# Patient Record
Sex: Female | Born: 2000 | Hispanic: No | Marital: Single
Health system: Southern US, Community
[De-identification: ages and names within clinical notes are randomized; demographics above are authoritative.]

---

## 2018-01-15 ENCOUNTER — Other Ambulatory Visit: Payer: Self-pay

## 2018-01-15 ENCOUNTER — Emergency Department (HOSPITAL_COMMUNITY)
Admission: EM | Admit: 2018-01-15 | Discharge: 2018-01-15 | Disposition: A | Discharge status: Home / Self Care | Attending: Emergency Medicine | Admitting: Emergency Medicine

## 2018-01-15 DIAGNOSIS — R030 Elevated blood-pressure reading, without diagnosis of hypertension: Secondary | ICD-10-CM | POA: Diagnosis not present

## 2018-01-15 DIAGNOSIS — L089 Local infection of the skin and subcutaneous tissue, unspecified: Secondary | ICD-10-CM | POA: Diagnosis not present

## 2018-01-15 DIAGNOSIS — Z4800 Encounter for change or removal of nonsurgical wound dressing: Secondary | ICD-10-CM | POA: Diagnosis present

## 2018-01-15 LAB — POC URINE PREG, ED: Preg Test, Ur: NEGATIVE

## 2018-01-15 MED ORDER — DOXYCYCLINE HYCLATE 100 MG PO TABS
100.0000 mg | ORAL_TABLET | Freq: Once | ORAL | Status: AC
  Administered 2018-01-15: 100 mg via ORAL
  Filled 2018-01-15: qty 1

## 2018-01-15 MED ORDER — DOXYCYCLINE HYCLATE 100 MG PO CAPS: 100.0000 mg | ORAL_CAPSULE | Freq: Two times a day (BID) | ORAL | 0 refills | Status: AC

## 2018-01-15 NOTE — ED Provider Notes (Signed)
[facility] EMERGENCY DEPARTMENT Provider Note   CSN: [id number] Arrival date & time: 01/15/18  1638     History   Chief Complaint Chief Complaint  Patient presents with  . Wound Check    HPI Renee Long is a 17 y.o. female.  HPI   Patient is a 17 year old female with no significant past medical history presenting for skin and soft tissue infections of the right lower extremity.  Patient reports that she presented to urgent care approximately 5 days ago for 2 areas on her right lower extremity just distal to the knee that began as pustules but were beginning to have expanding erythema.  Patient reports that they had completely drained at that time, and urgent care outlined the areas of redness and prescribed Keflex.  Patient reports that she has had temperature up to 99, but denies any chills, Reiger's, abdominal pain, nausea, vomiting.  Patient reports that she is continued to pick at the wounds, and is now notices an area of "black" in the center of the largest area.  Patient denies any known wound or injury to the area.  Tetanus shot up-to-date.  No past medical history on file.  There are no active problems to display for this patient.     OB History   None      Home Medications    Prior to Admission medications   Not on File    Family History No family history on file.  Social History Social History   Tobacco Use  . Smoking status: Not on file  Substance Use Topics  . Alcohol use: Not on file  . Drug use: Not on file     Allergies   Patient has no known allergies.   Review of Systems Review of Systems  Constitutional: Negative for chills and fever.  Gastrointestinal: Negative for abdominal pain, nausea and vomiting.  Musculoskeletal: Negative for myalgias.  Skin: Positive for color change and wound.  All other systems reviewed and are negative.    Physical Exam Updated Vital Signs BP (!) 146/91 (BP Location: Right Arm)    Pulse 87   Temp 98.4 F (36.9 C) (Oral)   Resp 18   SpO2 100%   Physical Exam  Constitutional: She appears well-developed and well-nourished. No distress.  HENT:  Head: Normocephalic and atraumatic.  Mouth/Throat: Oropharynx is clear and moist.  Eyes: Pupils are equal, round, and reactive to light. Conjunctivae and EOM are normal.  Neck: Normal range of motion. Neck supple.  Cardiovascular: Normal rate, regular rhythm, S1 normal and S2 normal.  No murmur heard. Pulmonary/Chest: Effort normal and breath sounds normal. She has no wheezes. She has no rales.  Abdominal: Soft. She exhibits no distension.  Musculoskeletal: Normal range of motion. She exhibits no edema or deformity.  Neurological: She is alert.  Cranial nerves grossly intact. Patient moves extremities symmetrically and with good coordination.  Skin: Skin is warm and dry. No rash noted. There is erythema.  There is a 2.5 cm in diameter area of induration lateral to the right knee with excoriated skin overlying but no fluctuance.  Patient has a well-healing lesion with 1.5 cm of surrounding erythema just distal to the patella of the right lower extremity  Psychiatric: She has a normal mood and affect. Her behavior is normal. Judgment and thought content normal.  Nursing note and vitals reviewed.      ED Treatments / Results  Labs (all labs ordered are listed, but only abnormal results are  displayed) Labs Reviewed  POC URINE PREG, ED    EKG None  Radiology No results found.  Procedures Procedures (including critical care time)  EMERGENCY DEPARTMENT US SOFT TISSUE INTERPRETATION "Study: Limited Soft Tissue Ultrasound"  INDICATIONS: Soft tissue infection Multiple views of the body part were obtained in real-time with a multi-frequency linear probe PERFORMED BY:  Myself IMAGES ARCHIVED?: No SIDE:Right  BODY PART:Lower extremity FINDINGS: No abcess noted and Cellulitis present INTERPRETATION:  No abcess  noted and Cellulitis present   CPT: Neck 16109-60  Upper extremity 76880-26  Axilla 45409-81  Chest wall 19147-82  Beast 95621-30  Upper back 86578-46  Lower back 96295-28  Abdominal wall 41324-40  Pelvic wall 10272-53  Lower extremity 66440-34  Other soft tissue 74259-56   Medications Ordered in ED Medications - No data to display   Initial Impression / Assessment and Plan / ED Course  I have reviewed the triage vital signs and the nursing notes.  Pertinent labs & imaging results that were available during my care of the patient were reviewed by me and considered in my medical decision making (see chart for details).     Patient nontoxic-appearing, afebrile, and in no acute distress.  Patient with what appears to be purulent skin and soft tissue infections of the right lower extremity that have already drained.  Ultrasound was performed at bedside, which reveals no fluid collection, and there is active conduit for drainage.  As patient was on Keflex, will switch to doxycycline for MRSA coverage.  Patient was given return precautions for any increasing erythema beyond the area marked in 48 hours, fevers, chills, or systemic symptoms.  Patient is in understanding and agrees with the plan of care.  Discussed elevated blood pressure with the patient.  Patient will follow-up with primary care.  Final Clinical Impressions(s) / ED Diagnoses   Final diagnoses:  Skin infection  Elevated blood-pressure reading without diagnosis of hypertension    ED Discharge Orders         Ordered    doxycycline (VIBRAMYCIN) 100 MG capsule  2 times daily     01/15/18 1839           Delia Chimes 01/15/18 1840    Linwood Dibbles, MD 01/17/18 0730

## 2018-01-15 NOTE — Discharge Instructions (Signed)
Please see the information and instructions below regarding your visit.  Your diagnoses today include:  1. Skin infection   2. Elevated blood-pressure reading without diagnosis of hypertension    Cellulitis is a superficial skin infection. Please take your antibiotics as prescribed for their ENTIRE prescribed duration.   Tests performed today include: See side panel of your discharge paperwork for testing performed today. Vital signs are listed at the bottom of these instructions.   Medications prescribed:    Take any prescribed medications only as prescribed, and any over the counter medications only as directed on the packaging.  1. Please take all of your antibiotics until finished.   You may develop abdominal discomfort or nausea from the antibiotic. If this occurs, you may take it with food. Some patients also get diarrhea with antibiotics. You may help offset this with probiotics which you can buy or get in yogurt. Do not eat or take the probiotics until 2 hours after your antibiotic. Some women develop vaginal yeast infections after antibiotics. If you develop unusual vaginal discharge after being on this medication, please see your primary care provider.   Some people develop allergies to antibiotics. Symptoms of antibiotic allergy can be mild and include a flat rash and itching. They can also be more serious and include:  ?Hives - Hives are raised, red patches of skin that are usually very itchy.  ?Lip or tongue swelling  ?Trouble swallowing or breathing  ?Blistering of the skin or mouth.  If you have any of these serious symptoms, please seek emergency medical care immediately.  Home care instructions:  Please follow any educational materials contained in this packet.    Keep affected area above the level of your heart when possible. Wash area gently twice a day with warm soapy water. Do not apply alcohol or hydrogen peroxide. Cover the area if it draining or weeping.    Follow-up instructions: Please follow-up with your primary care provider or the ED in 48 hours for a check of the infection if symptoms are not improving.   Return instructions:  Please return to the Emergency Department if you experience worsening symptoms. Call your doctor sooner or return to the ER if you develop worsening signs of infection such as: increased redness, increased pain, pus, fever, or other symptoms that concern you. Please monitor the area we marked with a pen today. Please return if you have any other emergent concerns.  Additional Information:   Your vital signs today were: BP (!) 146/91 (BP Location: Right Arm)    Pulse 87    Temp 98.4 F (36.9 C) (Oral)    Resp 18    SpO2 100%  If your blood pressure (BP) was elevated on multiple readings during this visit above 130 for the top number or above 80 for the bottom number, please have this repeated by your primary care provider within one month. --------------  Thank you for allowing [place] to participate in your care today. It was a pleasure taking care of you today!

## 2018-01-15 NOTE — ED Notes (Signed)
Patient verbalizes understanding of discharge instructions. Opportunity for questioning and answers were provided. Armband removed by staff, pt discharged from ED.  

## 2018-01-15 NOTE — ED Triage Notes (Addendum)
Pt arrives for further evaluation of a wound to the right lateral leg/knee that originally looked like an abscess, she was seen and given antibiotics. She states now it looks worse and has an area of black tissue to the center with some bleeding noted. Circle noted around wound that was marked by urgent care to monitor for expansion of redness. Pt also has one of these wounds to the front of the knee. Pt states this was not a spider bite.

## 2019-01-22 ENCOUNTER — Other Ambulatory Visit: Payer: Self-pay | Admitting: Family Medicine

## 2019-01-22 ENCOUNTER — Other Ambulatory Visit (HOSPITAL_COMMUNITY)
Admission: RE | Admit: 2019-01-22 | Discharge: 2019-01-22 | Disposition: A | Discharge status: Home / Self Care | Source: Ambulatory Visit | Attending: Family Medicine | Admitting: Family Medicine

## 2019-01-22 DIAGNOSIS — N898 Other specified noninflammatory disorders of vagina: Secondary | ICD-10-CM | POA: Diagnosis not present

## 2019-01-25 LAB — CERVICOVAGINAL ANCILLARY ONLY
Bacterial vaginitis: NEGATIVE
Candida vaginitis: NEGATIVE
Chlamydia: NEGATIVE
Neisseria Gonorrhea: NEGATIVE
Trichomonas: NEGATIVE

## 2020-05-24 ENCOUNTER — Emergency Department (HOSPITAL_COMMUNITY)
Admission: EM | Admit: 2020-05-24 | Discharge: 2020-05-24 | Disposition: A | Discharge status: Home / Self Care | Payer: Self-pay | Attending: Emergency Medicine | Admitting: Emergency Medicine

## 2020-05-24 ENCOUNTER — Other Ambulatory Visit: Payer: Self-pay

## 2020-05-24 ENCOUNTER — Emergency Department (HOSPITAL_COMMUNITY): Payer: Self-pay

## 2020-05-24 DIAGNOSIS — R030 Elevated blood-pressure reading, without diagnosis of hypertension: Secondary | ICD-10-CM | POA: Insufficient documentation

## 2020-05-24 DIAGNOSIS — R0789 Other chest pain: Secondary | ICD-10-CM | POA: Insufficient documentation

## 2020-05-24 NOTE — ED Triage Notes (Signed)
Patient c/o pain to sternum since last night. Reports "play wrestling with boyfriend and his head hit my sternum." Reports pain worsens with palpation and movement. Denies SOB.

## 2020-05-24 NOTE — ED Provider Notes (Signed)
[facility]-EMERGENCY DEPT Provider Note   CSN: [id number] Arrival date & time: 05/24/20  1429     History Chief Complaint  Patient presents with  . Chest Pain    Renee Long is a 20 y.o. female.  She has no significant past medical history.  She said she was struck in the sternum when she was wrestling with her boyfriend and was hit by his head.  Complaining of sternal pain worse with breathing and moving.  This occurred last night.  No hemoptysis.  No other injuries or complaints.  The history is provided by the patient.  Chest Pain Pain location:  Substernal area Pain quality: sharp   Pain radiates to:  Does not radiate Pain severity:  Moderate Onset quality:  Sudden Duration:  2 days Timing:  Constant Progression:  Unchanged Chronicity:  New Context: trauma   Relieved by:  Nothing Worsened by:  Coughing, deep breathing and movement Ineffective treatments:  None tried Associated symptoms: no abdominal pain, no cough, no diaphoresis, no fever, no headache and no shortness of breath        History reviewed. No pertinent past medical history.  There are no problems to display for this patient.   History reviewed. No pertinent surgical history.   OB History   No obstetric history on file.     History reviewed. No pertinent family history.     Home Medications Prior to Admission medications   Not on File    Allergies    Patient has no known allergies.  Review of Systems   Review of Systems  Constitutional: Negative for diaphoresis and fever.  HENT: Negative for sore throat.   Eyes: Negative for visual disturbance.  Respiratory: Negative for cough and shortness of breath.   Cardiovascular: Positive for chest pain.  Gastrointestinal: Negative for abdominal pain.  Genitourinary: Negative for dysuria.  Musculoskeletal: Negative for neck pain.  Skin: Negative for wound.  Neurological: Negative for headaches.    Physical  Exam Updated Vital Signs BP (!) 125/95 (BP Location: Left Arm)   Pulse 82   Temp 97.9 F (36.6 C) (Oral)   Resp 14   SpO2 100%   Physical Exam Vitals and nursing note reviewed.  Constitutional:      General: She is not in acute distress.    Appearance: She is well-developed and well-nourished.  HENT:     Head: Normocephalic and atraumatic.     Mouth/Throat:     Mouth: Oropharynx is clear and moist.  Eyes:     Conjunctiva/sclera: Conjunctivae normal.  Cardiovascular:     Rate and Rhythm: Normal rate and regular rhythm.     Pulses: Intact distal pulses.     Heart sounds: No murmur heard.   Pulmonary:     Effort: Pulmonary effort is normal. No respiratory distress.     Breath sounds: Normal breath sounds. No stridor. No wheezing.  Chest:     Chest wall: Tenderness (sternal) present. No crepitus.  Abdominal:     Palpations: Abdomen is soft.     Tenderness: There is no abdominal tenderness.  Musculoskeletal:        General: No tenderness or edema.     Cervical back: Neck supple.  Skin:    General: Skin is warm and dry.  Neurological:     General: No focal deficit present.     Mental Status: She is alert.     GCS: GCS eye subscore is 4. GCS verbal subscore is 5. GCS  motor subscore is 6.  Psychiatric:        Mood and Affect: Mood and affect normal.     ED Results / Procedures / Treatments   Labs (all labs ordered are listed, but only abnormal results are displayed) Labs Reviewed - No data to display  EKG None  Radiology DG Chest 2 View  Result Date: 05/24/2020 CLINICAL DATA:  Chest injury while wrestling yesterday. Midsternal region pain. EXAM: CHEST - 2 VIEW COMPARISON:  None. FINDINGS: Lungs are adequately inflated and otherwise clear. Cardiomediastinal silhouette is normal. Bones and soft tissues are normal. IMPRESSION: No acute findings. Electronically Signed   By: Elberta Fortis M.D.   On: 05/24/2020 15:58   DG Sternum  Result Date: 05/24/2020 CLINICAL  DATA:  Chest wall injury midsternal region after wrestling yesterday. EXAM: STERNUM - 2+ VIEW COMPARISON:  None. FINDINGS: There is no evidence of fracture or other focal bone lesions. IMPRESSION: No acute findings. Electronically Signed   By: Elberta Fortis M.D.   On: 05/24/2020 16:00    Procedures Procedures (including critical care time)  Medications Ordered in ED Medications - No data to display  ED Course  I have reviewed the triage vital signs and the nursing notes.  Pertinent labs & imaging results that were available during my care of the patient were reviewed by me and considered in my medical decision making (see chart for details).  Clinical Course as of 05/25/20 3785  Sat May 24, 2020  1549 Chest x-ray and sternal x-ray interpreted by me as no pneumothorax no gross fractures.  Awaiting radiology reading [MB]  1609 Reviewed results with patient she is comfortable plan for outpatient symptomatic management. [MB]    Clinical Course User Index [MB] Terrilee Files, MD   MDM Rules/Calculators/A&P                         Differential diagnosis includes contusion, sternal fracture, pneumothorax, rib fracture  Final Clinical Impression(s) / ED Diagnoses Final diagnoses:  Chest wall pain    Rx / DC Orders ED Discharge Orders    None       Terrilee Files, MD 05/25/20 [phone]

## 2020-05-24 NOTE — Discharge Instructions (Signed)
You were seen in the emergency department for evaluation of pain in the chest after a blunt chest injury.  You had x-rays of your chest and sternum that did not show any fracture.  You can try heating pad and ibuprofen.  Increase activity as tolerated.  Return if any worsening or concerning symptoms

## 2020-05-25 ENCOUNTER — Encounter (HOSPITAL_COMMUNITY): Payer: Self-pay | Admitting: Emergency Medicine

## 2021-05-28 ENCOUNTER — Encounter (HOSPITAL_COMMUNITY): Payer: Self-pay

## 2021-05-28 ENCOUNTER — Other Ambulatory Visit: Payer: Self-pay

## 2021-05-28 ENCOUNTER — Ambulatory Visit (HOSPITAL_COMMUNITY)
Admission: EM | Admit: 2021-05-28 | Discharge: 2021-05-28 | Disposition: A | Discharge status: Home / Self Care | Payer: 59 | Attending: Family Medicine | Admitting: Family Medicine

## 2021-05-28 DIAGNOSIS — M62838 Other muscle spasm: Secondary | ICD-10-CM | POA: Diagnosis not present

## 2021-05-28 DIAGNOSIS — M436 Torticollis: Secondary | ICD-10-CM | POA: Diagnosis not present

## 2021-05-28 MED ORDER — DIPHENHYDRAMINE HCL 50 MG/ML IJ SOLN
50.0000 mg | Freq: Once | INTRAMUSCULAR | Status: AC
  Administered 2021-05-28: 50 mg via INTRAMUSCULAR

## 2021-05-28 MED ORDER — DIPHENHYDRAMINE HCL 50 MG/ML IJ SOLN
INTRAMUSCULAR | Status: AC
  Filled 2021-05-28: qty 1

## 2021-05-28 MED ORDER — CYCLOBENZAPRINE HCL 10 MG PO TABS: ORAL_TABLET | ORAL | 0 refills | Status: AC

## 2021-05-28 NOTE — Discharge Instructions (Addendum)
Meds ordered this encounter  Medications   cyclobenzaprine (FLEXERIL) 10 MG tablet    Sig: Take 1 tablet by mouth 3 times daily as needed for muscle spasm. Warning: May cause drowsiness.    Dispense:  21 tablet    Refill:  0   diphenhydrAMINE (BENADRYL) injection 50 mg

## 2021-05-28 NOTE — ED Provider Notes (Signed)
°  [facility] CARE CENTER   [id number] 05/28/21 Arrival Time: 1433  ASSESSMENT & PLAN:  1. Torticollis, acute   2. Muscle spasms of neck   No trauma.  Meds ordered this encounter  Medications   cyclobenzaprine (FLEXERIL) 10 MG tablet    Sig: Take 1 tablet by mouth 3 times daily as needed for muscle spasm. Warning: May cause drowsiness.    Dispense:  21 tablet    Refill:  0   diphenhydrAMINE (BENADRYL) injection 50 mg   Work note provided.  Will f/u with her doctor or here if not seeing significant improvement within one week.  Reviewed expectations re: course of current medical issues. Questions answered. Outlined signs and symptoms indicating need for more acute intervention. Patient verbalized understanding. After Visit Summary given.  SUBJECTIVE: History from: patient. Renee Long is a 21 y.o. female who reports L sided neck pain/spasm; noted after waking 2 d ago. No trauma. No HA/visual changes. No extremity sensation changes or weakness. Ibuprofen without relief. No h/o similar..   OBJECTIVE:  Vitals:   05/28/21 1450  BP: 124/82  Pulse: 92  Resp: 16  Temp: 98.3 F (36.8 C)  TempSrc: Oral  SpO2: 100%    General appearance: alert; no distress HEENT: normocephalic; atraumatic Neck: supple with FROM but moves slowly; no midline tenderness; does have tenderness/tightness of cervical musculature extending over trapezius distribution only on the left Lungs: unlabored Heart: regular Extremities: moves all extremities normally; no edema; symmetrical with no gross deformities Skin: warm and dry Neurologic: gait normal; normal sensation and strength of all extremities Psychological: alert and cooperative; normal mood and affect  No Known Allergies History reviewed. No pertinent past medical history. History reviewed. No pertinent surgical history. No family history on file. Social History   Socioeconomic History   Marital status: Single    Spouse name: Not on  file   Number of children: Not on file   Years of education: Not on file   Highest education level: Not on file  Occupational History   Not on file  Tobacco Use   Smoking status: Not on file   Smokeless tobacco: Not on file  Substance and Sexual Activity   Alcohol use: Not on file   Drug use: Not on file   Sexual activity: Not on file  Other Topics Concern   Not on file  Social History Narrative   Not on file   Social Determinants of Health   Financial Resource Strain: Not on file  Food Insecurity: Not on file  Transportation Needs: Not on file  Physical Activity: Not on file  Stress: Not on file  Social Connections: Not on file           Mardella Layman, MD 05/28/21 1617

## 2021-05-28 NOTE — ED Triage Notes (Signed)
Pt presents to the office for right side neck pain x 2 days. Denis any injury to the area.

## 2021-10-20 DIAGNOSIS — R69 Illness, unspecified: Secondary | ICD-10-CM | POA: Diagnosis not present

## 2021-10-20 DIAGNOSIS — Z124 Encounter for screening for malignant neoplasm of cervix: Secondary | ICD-10-CM | POA: Diagnosis not present

## 2021-10-20 DIAGNOSIS — Z01419 Encounter for gynecological examination (general) (routine) without abnormal findings: Secondary | ICD-10-CM | POA: Diagnosis not present

## 2021-10-20 DIAGNOSIS — R87610 Atypical squamous cells of undetermined significance on cytologic smear of cervix (ASC-US): Secondary | ICD-10-CM | POA: Diagnosis not present

## 2021-10-20 DIAGNOSIS — Z30011 Encounter for initial prescription of contraceptive pills: Secondary | ICD-10-CM | POA: Diagnosis not present

## 2022-11-03 DIAGNOSIS — Z113 Encounter for screening for infections with a predominantly sexual mode of transmission: Secondary | ICD-10-CM | POA: Diagnosis not present

## 2022-11-03 DIAGNOSIS — Z124 Encounter for screening for malignant neoplasm of cervix: Secondary | ICD-10-CM | POA: Diagnosis not present

## 2022-11-03 DIAGNOSIS — Z01419 Encounter for gynecological examination (general) (routine) without abnormal findings: Secondary | ICD-10-CM | POA: Diagnosis not present

## 2022-11-03 DIAGNOSIS — N898 Other specified noninflammatory disorders of vagina: Secondary | ICD-10-CM | POA: Diagnosis not present

## 2022-11-03 DIAGNOSIS — Z3041 Encounter for surveillance of contraceptive pills: Secondary | ICD-10-CM | POA: Diagnosis not present

## 2022-12-20 IMAGING — CR DG CHEST 2V
2 series · 2 of 2 positions shown · non-contrast
Comparison: None.

CLINICAL DATA: Chest injury while wrestling yesterday. Midsternal
region pain.

EXAM:
CHEST - 2 VIEW

[w chest pa]
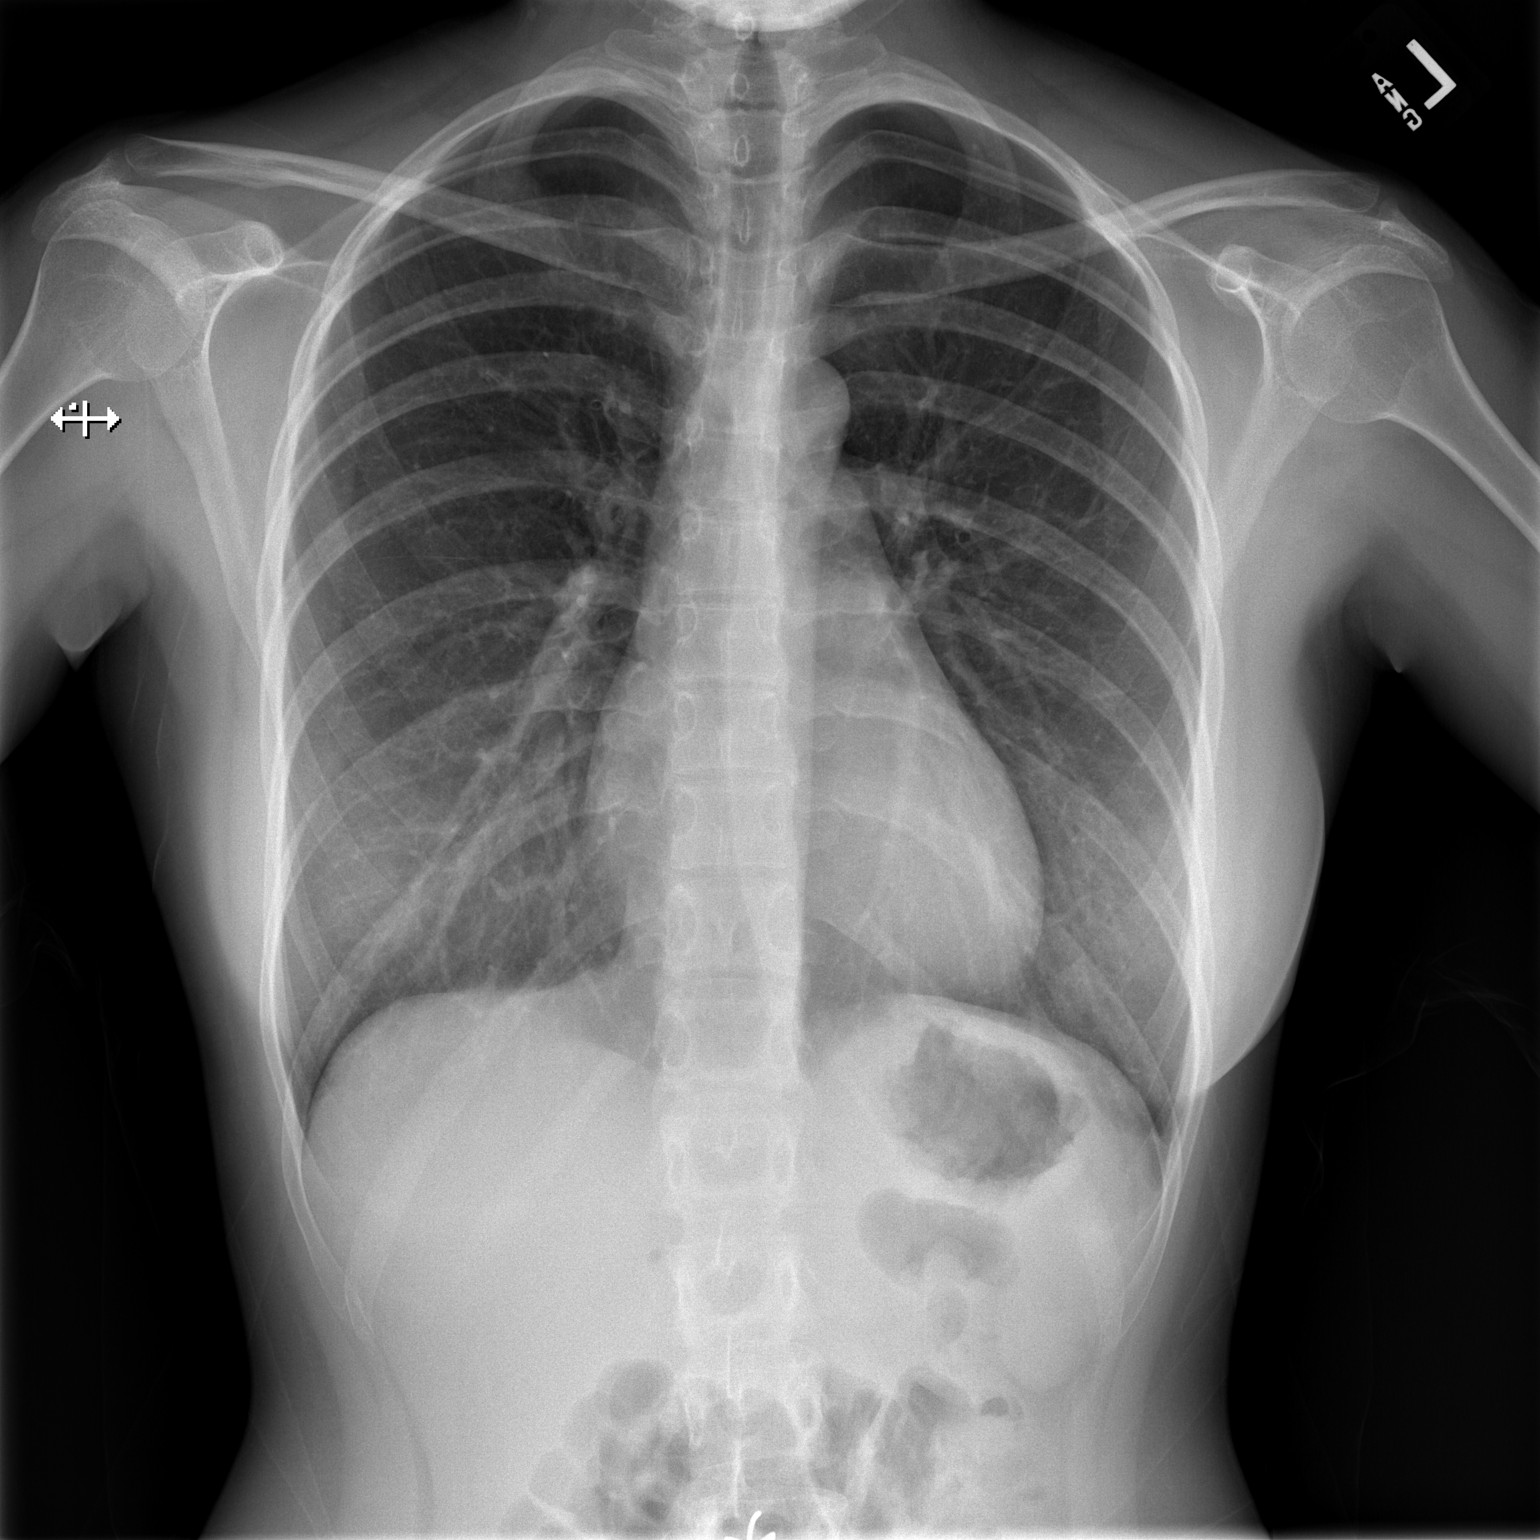

[w chest lat]
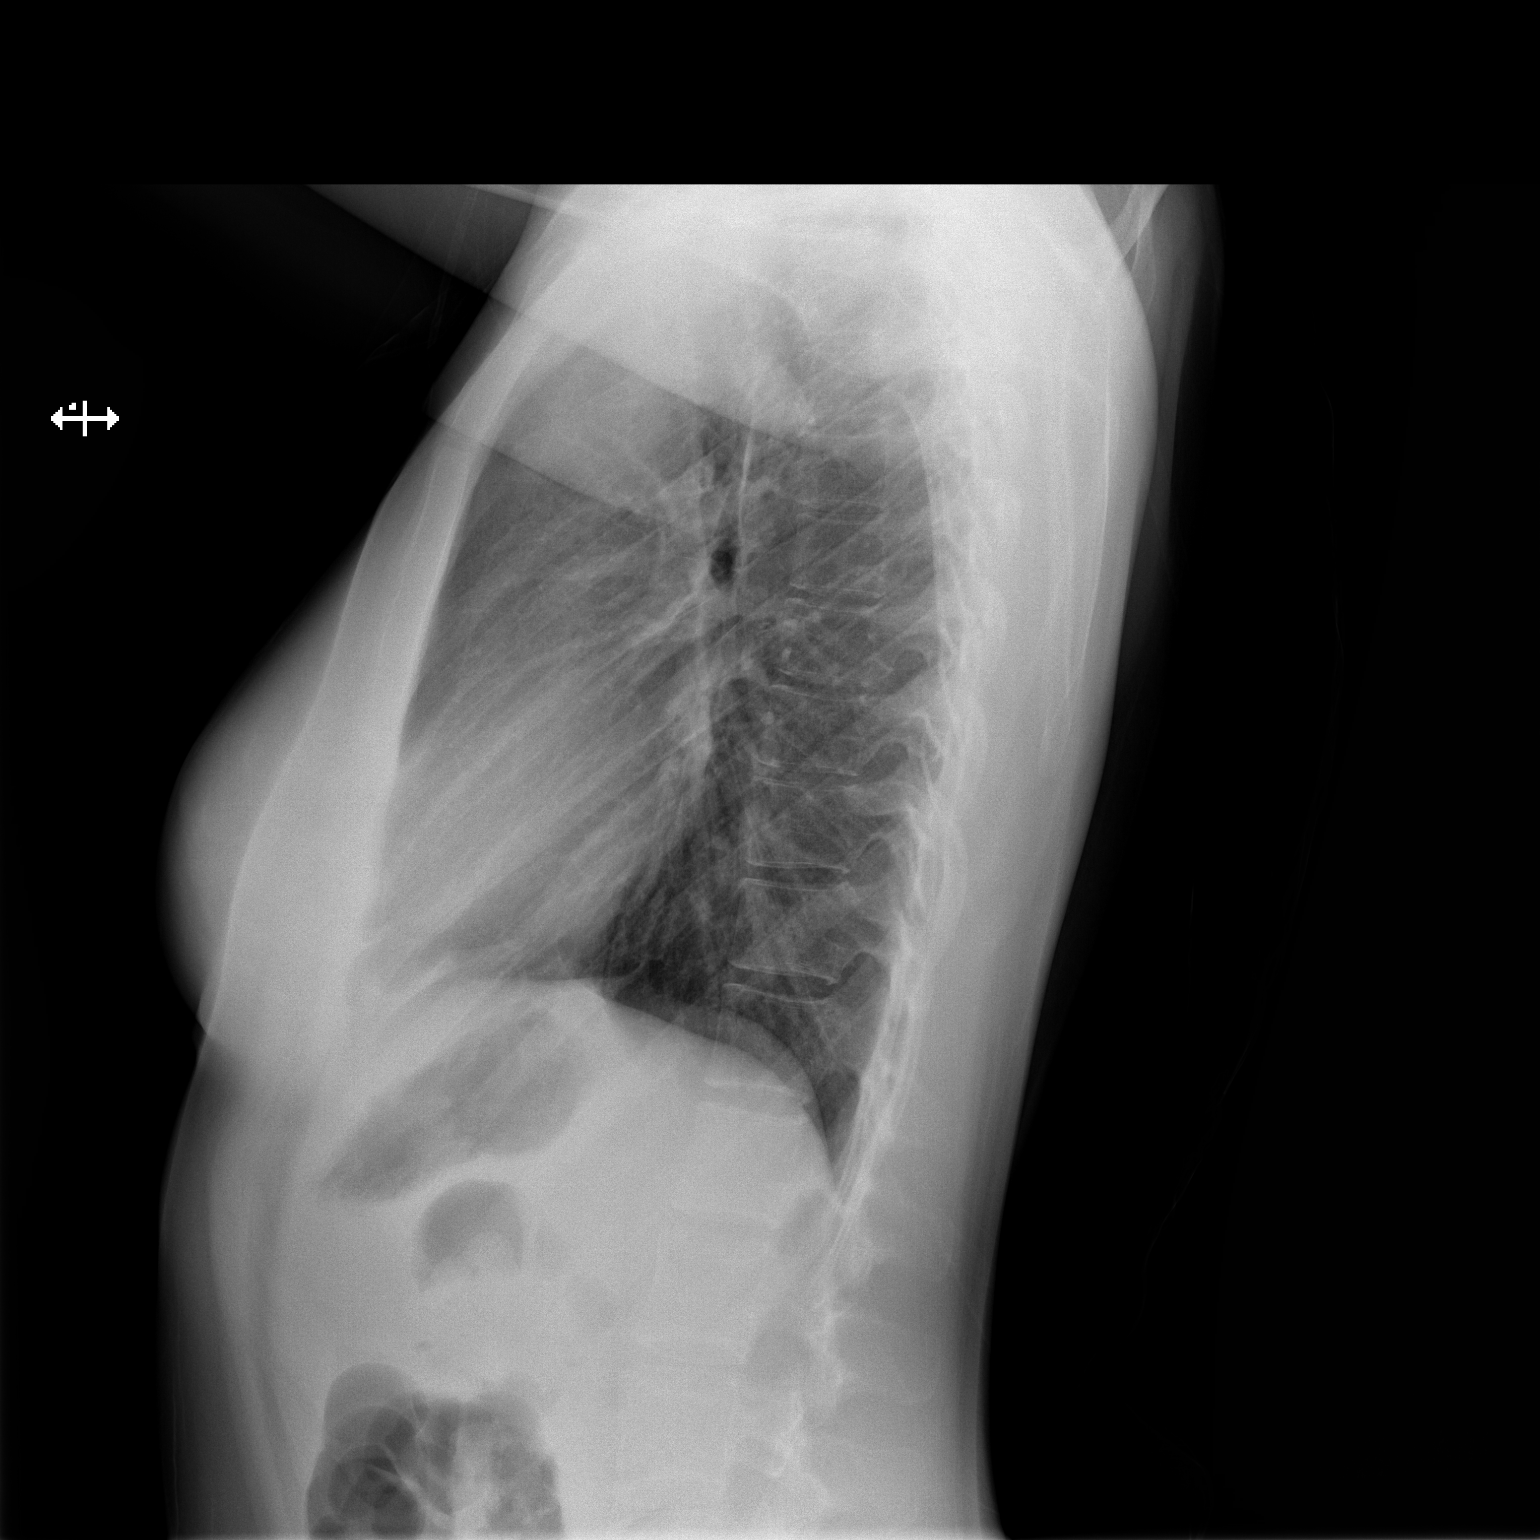

[2 of 2 positions shown; findings below may reference images not displayed]

FINDINGS: Lungs are adequately inflated and otherwise clear. Cardiomediastinal
silhouette is normal. Bones and soft tissues are normal.
IMPRESSION: No acute findings.

## 2023-12-22 DIAGNOSIS — Z124 Encounter for screening for malignant neoplasm of cervix: Secondary | ICD-10-CM | POA: Diagnosis not present

## 2023-12-22 DIAGNOSIS — Z309 Encounter for contraceptive management, unspecified: Secondary | ICD-10-CM | POA: Diagnosis not present

## 2023-12-22 DIAGNOSIS — N898 Other specified noninflammatory disorders of vagina: Secondary | ICD-10-CM | POA: Diagnosis not present

## 2023-12-22 DIAGNOSIS — Z113 Encounter for screening for infections with a predominantly sexual mode of transmission: Secondary | ICD-10-CM | POA: Diagnosis not present

## 2023-12-22 DIAGNOSIS — Z1151 Encounter for screening for human papillomavirus (HPV): Secondary | ICD-10-CM | POA: Diagnosis not present

## 2023-12-22 DIAGNOSIS — Z01419 Encounter for gynecological examination (general) (routine) without abnormal findings: Secondary | ICD-10-CM | POA: Diagnosis not present
